# Patient Record
Sex: Female | Born: 1955 | Hispanic: Yes | Marital: Single
Health system: Southern US, Community
[De-identification: ages and names within clinical notes are randomized; demographics above are authoritative.]

---

## 2017-12-04 ENCOUNTER — Other Ambulatory Visit: Payer: Self-pay | Admitting: Occupational Medicine

## 2017-12-04 ENCOUNTER — Ambulatory Visit
Admission: RE | Admit: 2017-12-04 | Discharge: 2017-12-04 | Disposition: A | Discharge status: Home / Self Care | Payer: Worker's Compensation | Source: Ambulatory Visit | Attending: Occupational Medicine | Admitting: Occupational Medicine

## 2017-12-04 DIAGNOSIS — W19XXXA Unspecified fall, initial encounter: Secondary | ICD-10-CM

## 2018-02-05 ENCOUNTER — Other Ambulatory Visit: Payer: Self-pay | Admitting: Occupational Medicine

## 2018-02-05 ENCOUNTER — Ambulatory Visit: Payer: Worker's Compensation

## 2018-02-05 DIAGNOSIS — M542 Cervicalgia: Secondary | ICD-10-CM

## 2018-04-26 ENCOUNTER — Other Ambulatory Visit: Payer: Self-pay | Admitting: Orthopedic Surgery

## 2018-04-26 DIAGNOSIS — M542 Cervicalgia: Secondary | ICD-10-CM

## 2018-04-26 DIAGNOSIS — M25511 Pain in right shoulder: Secondary | ICD-10-CM

## 2019-03-30 ENCOUNTER — Ambulatory Visit: Payer: Self-pay | Attending: Internal Medicine

## 2019-03-30 DIAGNOSIS — Z23 Encounter for immunization: Secondary | ICD-10-CM

## 2019-03-30 NOTE — Progress Notes (Signed)
   Covid-19 Vaccination Clinic  Name:  Marissa Rojas    MRN: [id number] DOB: [birth year]  03/30/2019  Ms. Marissa Rojas was observed post Covid-19 immunization for 15 minutes without incident. She was provided with Vaccine Information Sheet and instruction to access the V-Safe system.   Ms. Marissa Rojas was instructed to call 911 with any severe reactions post vaccine: [name] Kitchen Difficulty breathing  . Swelling of face and throat  . A fast heartbeat  . A bad rash all over body  . Dizziness and weakness   Immunizations Administered    Name Date Dose VIS Date Route   Pfizer COVID-19 Vaccine 03/30/2019 12:15 PM 0.3 mL 12/14/2018 Intramuscular   Manufacturer: [employer], [employer]   Lot: [id number]   NDC: [address]-2

## 2019-04-23 ENCOUNTER — Ambulatory Visit: Payer: Self-pay | Attending: Internal Medicine

## 2019-04-23 DIAGNOSIS — Z23 Encounter for immunization: Secondary | ICD-10-CM

## 2019-04-23 NOTE — Progress Notes (Signed)
   Covid-19 Vaccination Clinic  Name:  Samaya Boardley    MRN: [id number] DOB: November 01, 1955  04/23/2019  Ms. Reyes-Martinez was observed post Covid-19 immunization for 15 minutes without incident. She was provided with Vaccine Information Sheet and instruction to access the V-Safe system.   Ms. Deegan was instructed to call 911 with any severe reactions post vaccine: [name] Kitchen Difficulty breathing  . Swelling of face and throat  . A fast heartbeat  . A bad rash all over body  . Dizziness and weakness   Immunizations Administered    Name Date Dose VIS Date Route   Pfizer COVID-19 Vaccine 04/23/2019 11:18 AM 0.3 mL 02/27/2018 Intramuscular   Manufacturer: [employer], [employer]   Lot: [id number]   NDC: [address]-8

## 2020-11-07 IMAGING — DX DG CERVICAL SPINE COMPLETE 4+V
5 series · 5 of 5 positions shown · non-contrast
Comparison: None.

CLINICAL DATA: Fall 1 month ago with persistent pain, initial
encounter

EXAM:
CERVICAL SPINE - COMPLETE 4+ VIEW

[c-spine lat]
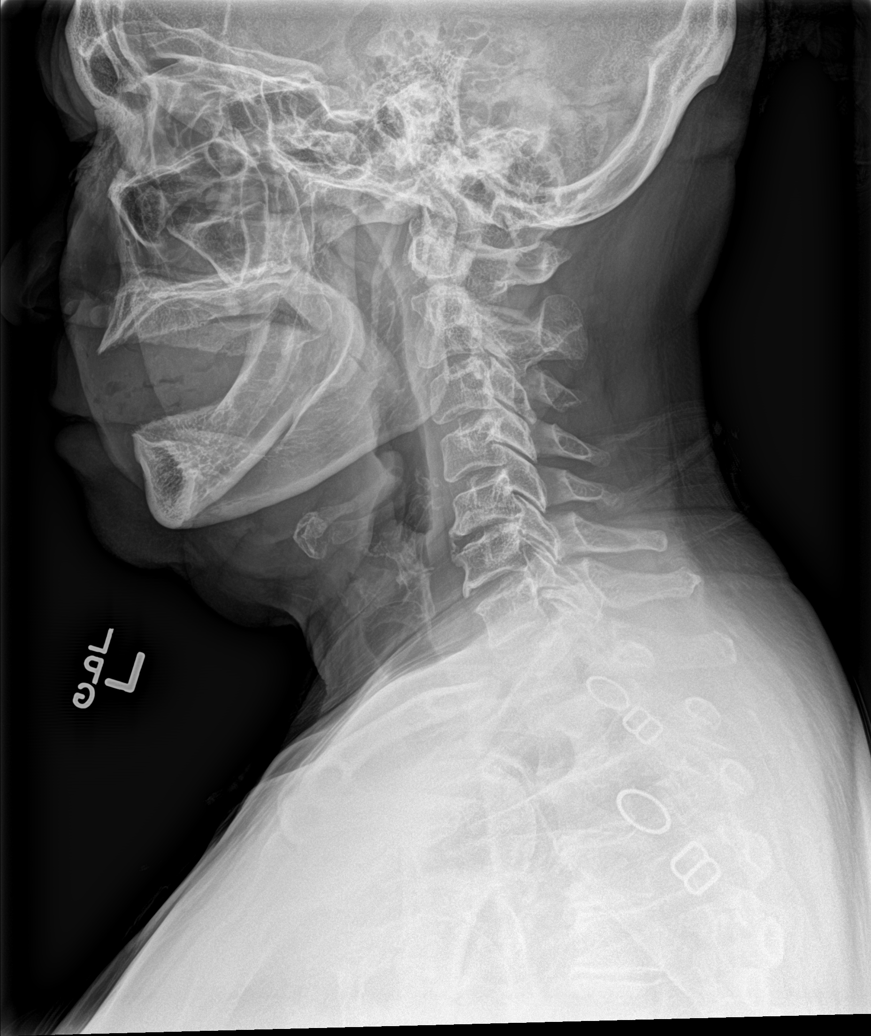

[c-spine obl (1 of 2)]
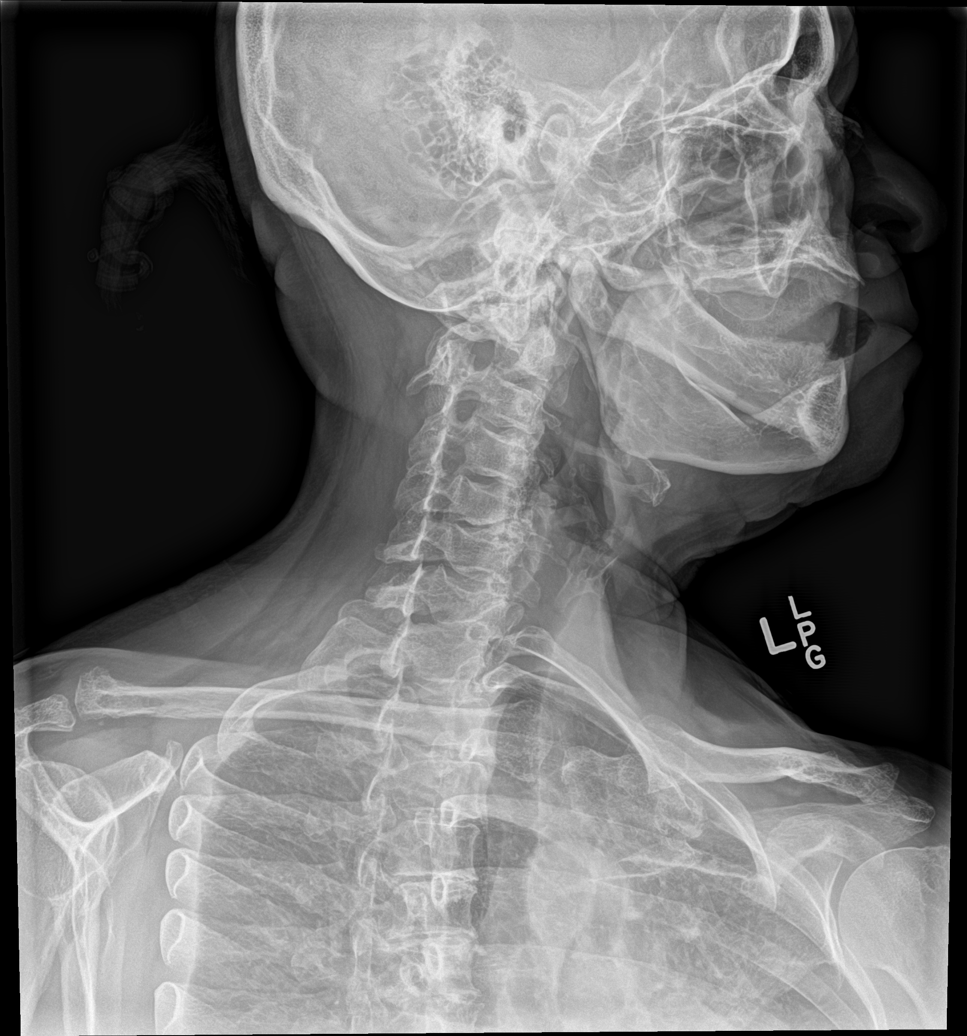

[c-spine obl (2 of 2)]
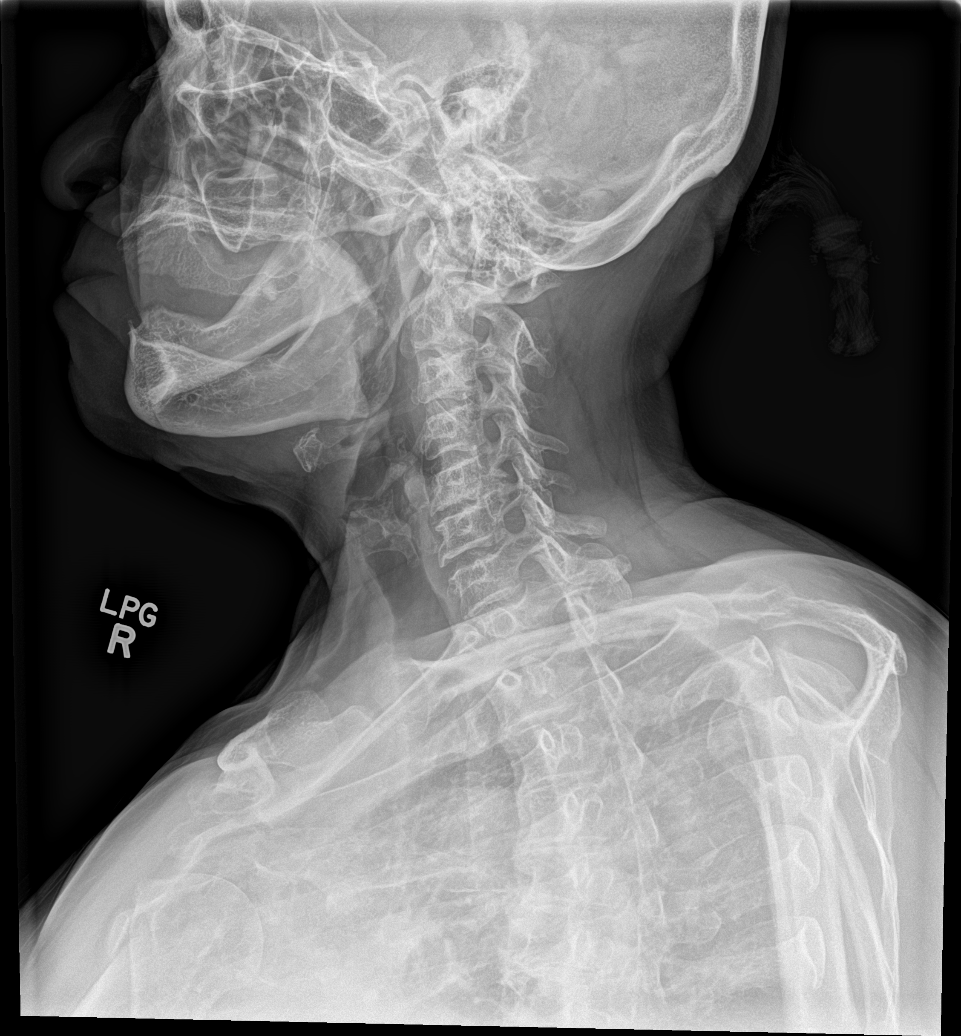

[c-spine ap]
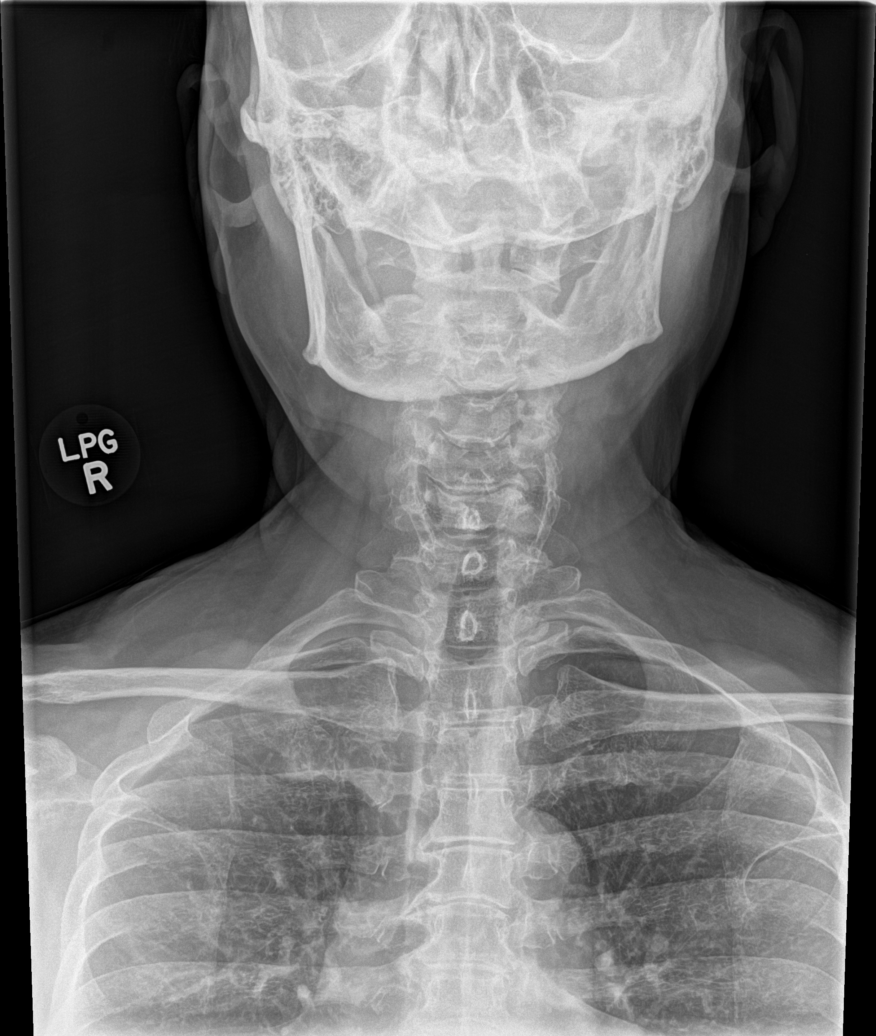

[c-spine open mouth]
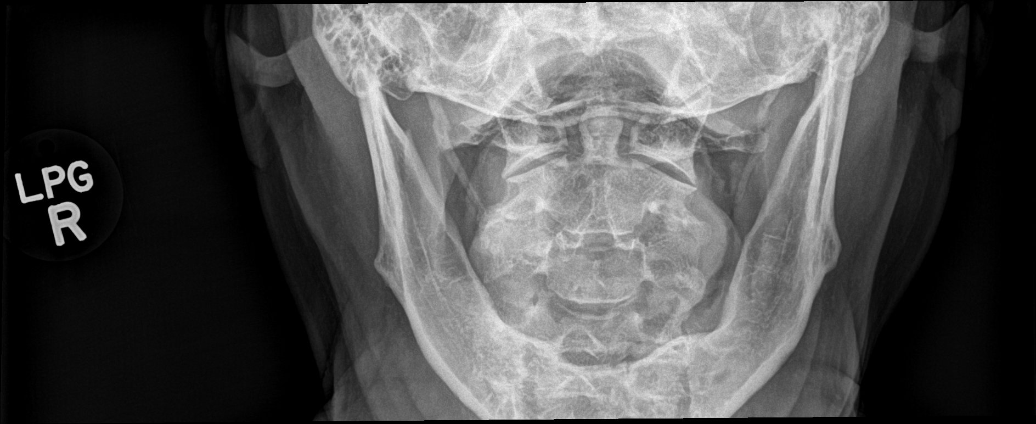

[5 of 5 positions shown; findings below may reference images not displayed]

FINDINGS: Seven cervical segments are well visualized. Vertebral body height
is well maintained. Mild osteophytic changes are noted at C5-6 and
C6-7. Multilevel facet hypertrophic changes are seen. Minimal neural
foraminal narrowing is noted at C5-6 particularly on the left. No
acute fracture or acute facet abnormality is noted. The odontoid is
within normal limits. No soft tissue abnormality is noted.
IMPRESSION: Degenerative change without acute abnormality.
